# Patient Record
Sex: Male | Born: 2000 | Race: Black or African American | Hispanic: No | Marital: Single | Smoking: Never smoker
Health system: Southern US, Community
[De-identification: ages and names within clinical notes are randomized; demographics above are authoritative.]

---
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
## 2020-04-12 IMAGING — CR DG FOOT COMPLETE 3+V*R*
3 series · 3 of 3 positions shown · non-contrast
Comparison: None.

CLINICAL DATA: Right heel pain for a week.  No injury.

EXAM:
RIGHT FOOT COMPLETE - 3+ VIEW

[x foot ap right]
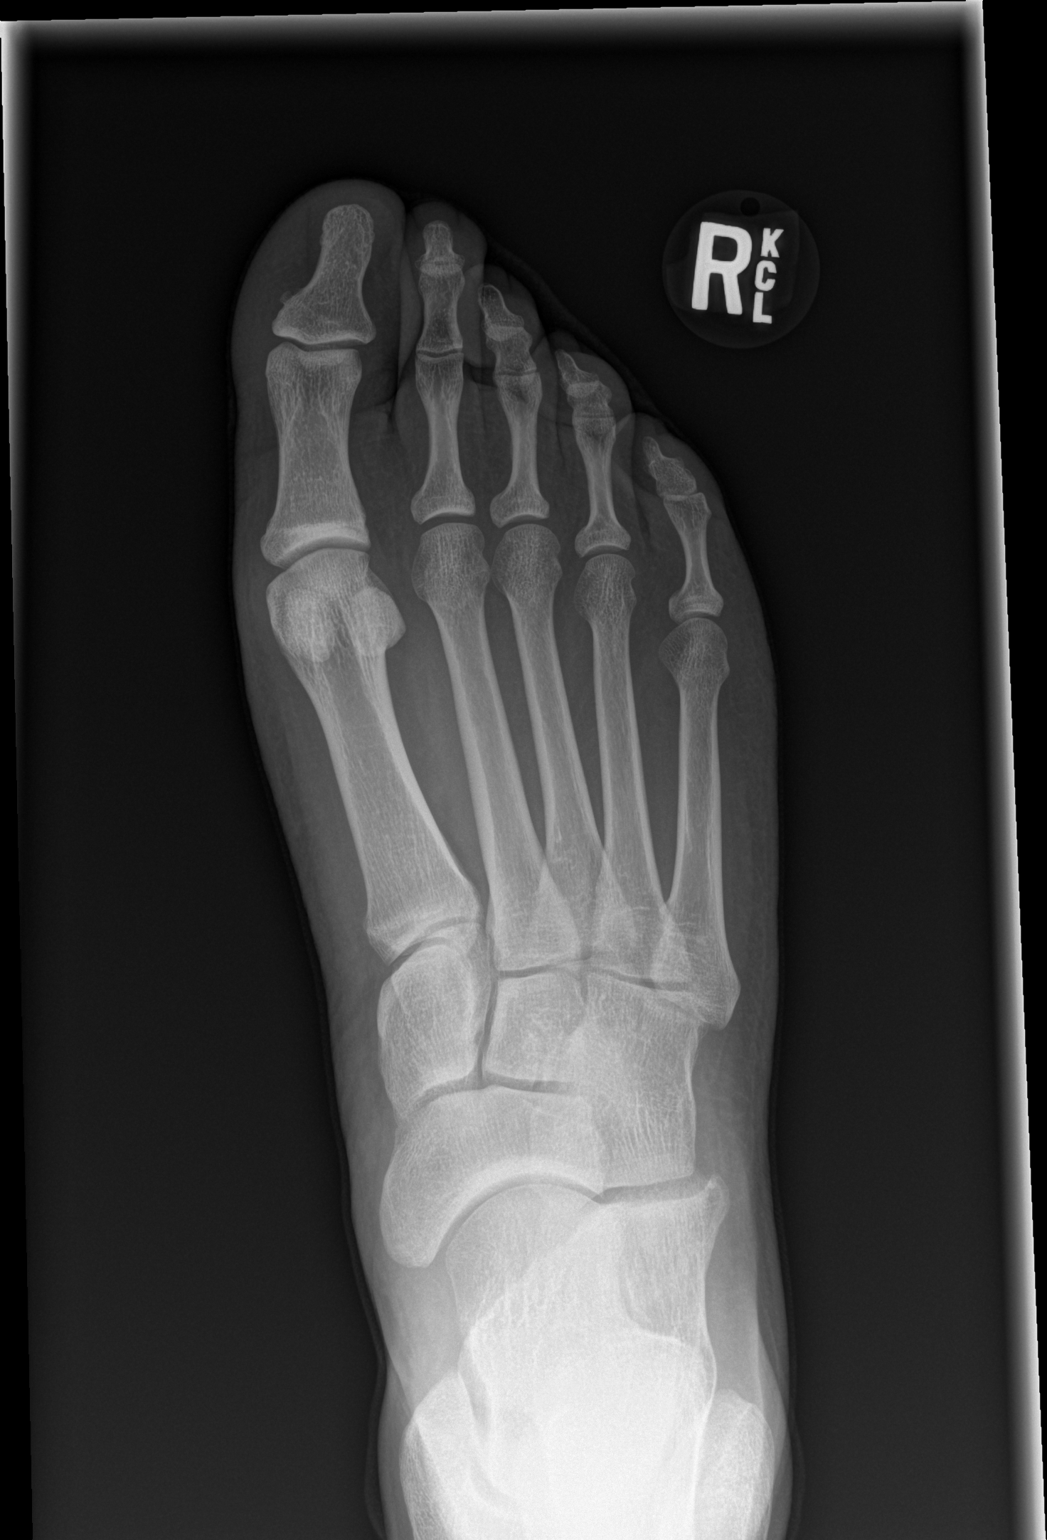

[x foot obl right]
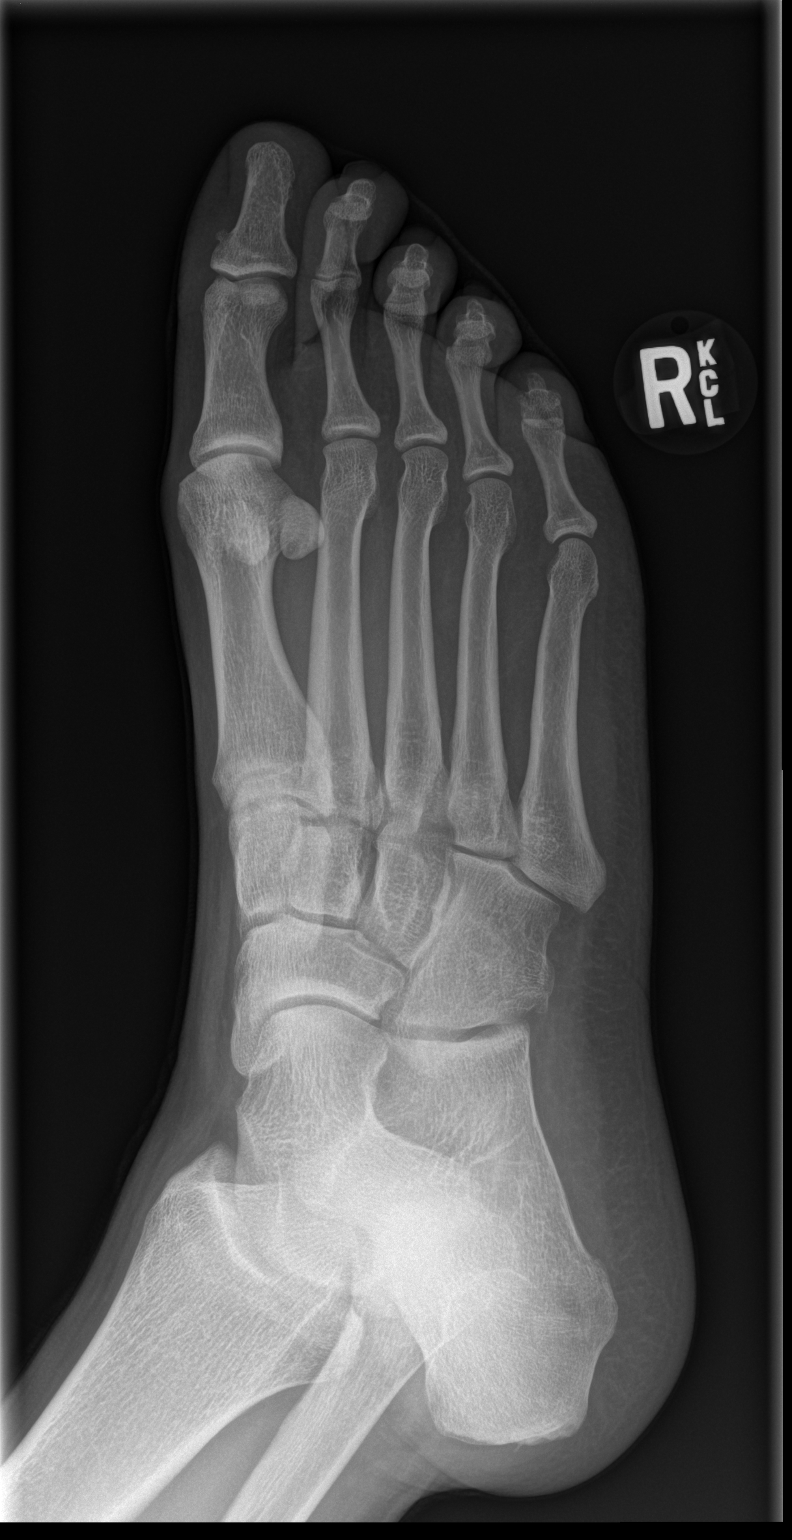

[x foot lat right]
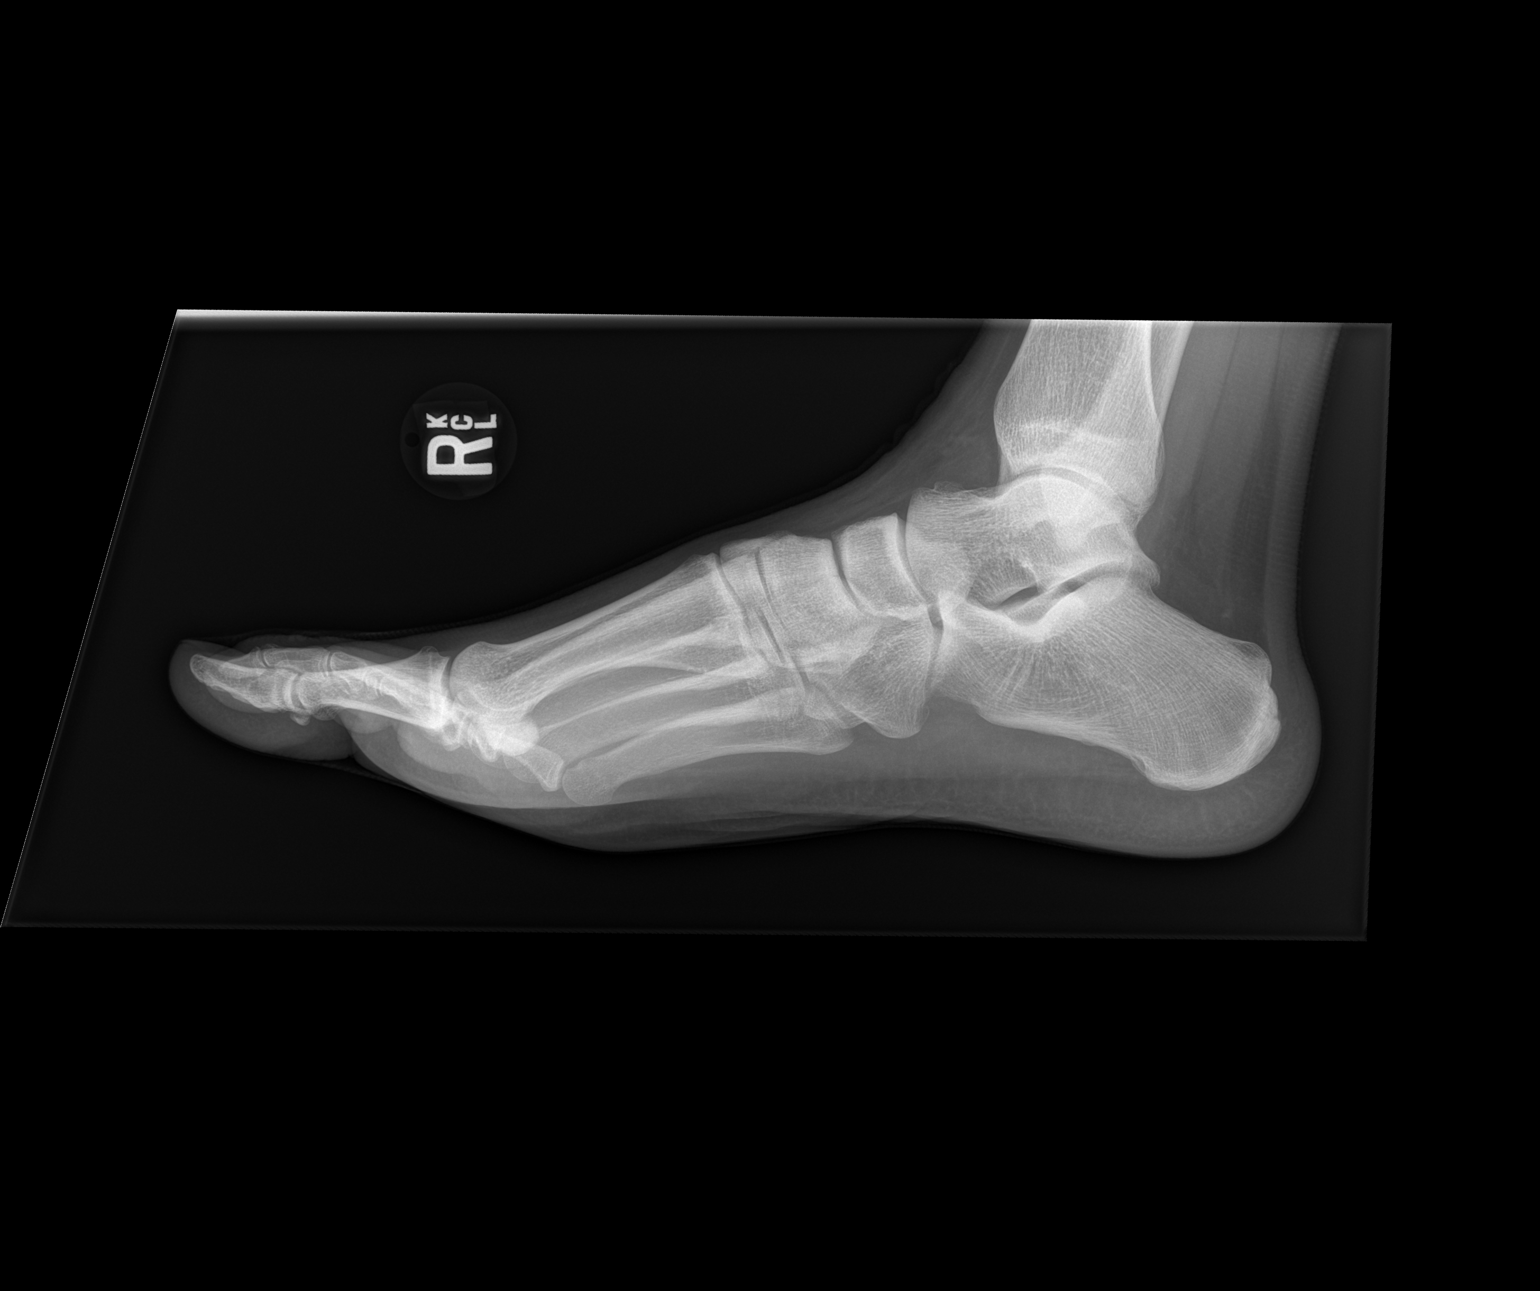

[3 of 3 positions shown; findings below may reference images not displayed]

FINDINGS: Increased attenuation in the subcutaneous fat over the posterior
calcaneus. No bony abnormalities.
IMPRESSION: No fracture noted. Possible soft tissue edema in the subcutaneous
fat over the calcaneus on the lateral view. No foreign body
identified.

## 2023-10-11 ENCOUNTER — Emergency Department (HOSPITAL_COMMUNITY)
Admission: EM | Admit: 2023-10-11 | Discharge: 2023-10-11 | Discharge status: Against Medical Advice | Payer: Self-pay | Attending: Emergency Medicine | Admitting: Emergency Medicine

## 2023-10-11 ENCOUNTER — Encounter (HOSPITAL_COMMUNITY): Payer: Self-pay

## 2023-10-11 ENCOUNTER — Emergency Department (HOSPITAL_COMMUNITY): Payer: Self-pay

## 2023-10-11 ENCOUNTER — Other Ambulatory Visit: Payer: Self-pay

## 2023-10-11 DIAGNOSIS — R300 Dysuria: Secondary | ICD-10-CM | POA: Insufficient documentation

## 2023-10-11 DIAGNOSIS — R1110 Vomiting, unspecified: Secondary | ICD-10-CM | POA: Insufficient documentation

## 2023-10-11 DIAGNOSIS — Z5321 Procedure and treatment not carried out due to patient leaving prior to being seen by health care provider: Secondary | ICD-10-CM | POA: Insufficient documentation

## 2023-10-11 DIAGNOSIS — R109 Unspecified abdominal pain: Secondary | ICD-10-CM | POA: Insufficient documentation

## 2023-10-11 LAB — URINALYSIS, ROUTINE W REFLEX MICROSCOPIC
Bilirubin Urine: NEGATIVE
Glucose, UA: NEGATIVE mg/dL
Hgb urine dipstick: NEGATIVE
Ketones, ur: 20 mg/dL — AB
Leukocytes,Ua: NEGATIVE
Nitrite: NEGATIVE
Protein, ur: 30 mg/dL — AB
Specific Gravity, Urine: 1.023 (ref 1.005–1.030)
pH: 7 (ref 5.0–8.0)

## 2023-10-11 LAB — CBC
HCT: 44.6 % (ref 39.0–52.0)
Hemoglobin: 15.3 g/dL (ref 13.0–17.0)
MCH: 31.7 pg (ref 26.0–34.0)
MCHC: 34.3 g/dL (ref 30.0–36.0)
MCV: 92.3 fL (ref 80.0–100.0)
Platelets: 272 10*3/uL (ref 150–400)
RBC: 4.83 MIL/uL (ref 4.22–5.81)
RDW: 12.2 % (ref 11.5–15.5)
WBC: 17.3 10*3/uL — ABNORMAL HIGH (ref 4.0–10.5)
nRBC: 0 % (ref 0.0–0.2)

## 2023-10-11 LAB — BASIC METABOLIC PANEL WITH GFR
Anion gap: 12 (ref 5–15)
BUN: 7 mg/dL (ref 6–20)
CO2: 24 mmol/L (ref 22–32)
Calcium: 9.5 mg/dL (ref 8.9–10.3)
Chloride: 102 mmol/L (ref 98–111)
Creatinine, Ser: 0.93 mg/dL (ref 0.61–1.24)
GFR, Estimated: >60 mL/min (ref >60)
Glucose, Bld: 103 mg/dL — ABNORMAL HIGH (ref 70–99)
Potassium: 3.6 mmol/L (ref 3.5–5.1)
Sodium: 138 mmol/L (ref 135–145)

## 2023-10-11 MED ORDER — IOHEXOL 350 MG/ML SOLN
75.0000 mL | Freq: Once | INTRAVENOUS | Status: AC | PRN
  Administered 2023-10-11: 75 mL via INTRAVENOUS

## 2023-10-11 MED ORDER — OXYCODONE-ACETAMINOPHEN 5-325 MG PO TABS
1.0000 | ORAL_TABLET | ORAL | Status: DC | PRN
  Administered 2023-10-11: 1 via ORAL
  Filled 2023-10-11: qty 1

## 2023-10-11 MED ORDER — ONDANSETRON 4 MG PO TBDP
4.0000 mg | ORAL_TABLET | Freq: Once | ORAL | Status: AC
  Administered 2023-10-11: 4 mg via ORAL
  Filled 2023-10-11: qty 1

## 2023-10-11 NOTE — ED Triage Notes (Signed)
 Reports right flank pain that started this morning associated with vomiting and mild pain with urination.

## 2023-10-11 NOTE — ED Notes (Signed)
 Patient stated he wanted to go home and not wait any longer, removed iv and pulled OTF.
